# Patient Record
Sex: Female | Born: 1960 | Race: White | Hispanic: No | Marital: Married | Smoking: Never smoker
Health system: Southern US, Community
[De-identification: ages and names within clinical notes are randomized; demographics above are authoritative.]

## PROBLEM LIST (undated history)

## (undated) HISTORY — PX: NECK SURGERY: SHX720

---

## 2017-03-03 ENCOUNTER — Emergency Department (HOSPITAL_COMMUNITY): Payer: Managed Care, Other (non HMO)

## 2017-03-03 ENCOUNTER — Encounter (HOSPITAL_COMMUNITY): Payer: Self-pay | Admitting: Emergency Medicine

## 2017-03-03 ENCOUNTER — Emergency Department (HOSPITAL_COMMUNITY)
Admission: EM | Admit: 2017-03-03 | Discharge: 2017-03-03 | Disposition: A | Discharge status: Home / Self Care | Payer: Managed Care, Other (non HMO) | Attending: Emergency Medicine | Admitting: Emergency Medicine

## 2017-03-03 ENCOUNTER — Other Ambulatory Visit: Payer: Self-pay

## 2017-03-03 DIAGNOSIS — W108XXA Fall (on) (from) other stairs and steps, initial encounter: Secondary | ICD-10-CM | POA: Insufficient documentation

## 2017-03-03 DIAGNOSIS — M25571 Pain in right ankle and joints of right foot: Secondary | ICD-10-CM

## 2017-03-03 DIAGNOSIS — M79671 Pain in right foot: Secondary | ICD-10-CM

## 2017-03-03 DIAGNOSIS — M79672 Pain in left foot: Secondary | ICD-10-CM | POA: Diagnosis not present

## 2017-03-03 DIAGNOSIS — W19XXXA Unspecified fall, initial encounter: Secondary | ICD-10-CM

## 2017-03-03 NOTE — ED Notes (Signed)
Brother in waiting room

## 2017-03-03 NOTE — ED Notes (Signed)
Still in radiology, tech first called

## 2017-03-03 NOTE — ED Provider Notes (Signed)
[facility] EMERGENCY DEPARTMENT Provider Note   CSN: [id number] Arrival date & time: 03/03/17  1900     History   Chief Complaint Chief Complaint  Patient presents with  . Ankle Pain    HPI Cassandra Levine is a 57 y.o. female who presents today for evaluation of acute onset, constant bilateral foot and right ankle pain secondary to fall at around 7 PM.  Patient states that she was going down a few steps while holding an object in both hands when she tripped and inverted her right ankle.  She denies head injury or loss of consciousness.  She endorses initially sharp pain in the right ankle which is now a throbbing pain which radiates to the dorsum of the right foot as well as a mild tenderness to the dorsum of the left foot.  She denies numbness, tingling, or weakness.  She states she took 600 mg of ibuprofen and has been applying ice to the right foot with improvement in her symptoms.  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  The history is provided by the patient.    History reviewed. No pertinent past medical history.  There are no active problems to display for this patient.   Past Surgical History:  Procedure Laterality Date  . NECK SURGERY      OB History    No data available       Home Medications    Prior to Admission medications   Not on File    Family History No family history on file.  Social History Social History   Tobacco Use  . Smoking status: Never Smoker  . Smokeless tobacco: Never Used  Substance Use Topics  . Alcohol use: Yes    Comment: occ  . Drug use: No     Allergies   Bactrim [sulfamethoxazole-trimethoprim] and Ciprofloxacin   Review of Systems Review of Systems  Constitutional: Negative for chills and fever.  Musculoskeletal: Positive for arthralgias (Bilateral foot and right ankle pain).  Neurological: Negative for syncope, weakness, numbness and headaches.     Physical Exam Updated Vital  Signs BP (!) 143/96   Pulse 80   Temp (!) 97.4 F (36.3 C) (Oral)   Resp 18   Ht 5\' 6"  (1.676 m)   Wt 68 kg (150 lb)   SpO2 100%   BMI 24.21 kg/m   Physical Exam  Constitutional: She appears well-developed and well-nourished. No distress.  HENT:  Head: Normocephalic and atraumatic.  Eyes: Conjunctivae are normal. Right eye exhibits no discharge. Left eye exhibits no discharge.  Neck: No JVD present. No tracheal deviation present.  Cardiovascular: Normal rate and intact distal pulses.  2+ DP/PT pulses bilaterally no calf tenderness  Pulmonary/Chest: Effort normal.  Abdominal: She exhibits no distension.  Musculoskeletal: She exhibits tenderness. She exhibits no edema.       Right ankle: She exhibits no swelling, no ecchymosis, no deformity, no laceration and normal pulse. Tenderness. Lateral malleolus tenderness found. No medial malleolus, no AITFL, no CF ligament, no posterior TFL, no head of 5th metatarsal and no proximal fibula tenderness found. Achilles tendon normal.       Left ankle: Normal. Achilles tendon normal.       Left foot: There is normal range of motion, no tenderness, no bony tenderness, no swelling, normal capillary refill, no crepitus, no deformity and no laceration.  Superficial skin abrasion noted to the dorsum of the left foot.  No bleeding.  No tenderness to  palpation.  Mildly decreased range of motion of the right ankle with dorsiflexion.  Point tenderness overlying the lateral malleolus and lateral aspect of the right foot with no ecchymosis, deformity, or crepitus.  No tenderness to palpation of the medial malleolus.  5/5 strength of BUE and BLE major muscle groups.  Antalgic gait, but able to bear weight on the right lower extremity  Neurological: She is alert. No sensory deficit. She exhibits normal muscle tone.  Fluent speech, no facial droop, sensation intact to soft touch of bilateral lower extremities  Skin: Skin is warm and dry. No erythema.   Psychiatric: She has a normal mood and affect. Her behavior is normal.  Nursing note and vitals reviewed.    ED Treatments / Results  Labs (all labs ordered are listed, but only abnormal results are displayed) Labs Reviewed - No data to display  EKG  EKG Interpretation None       Radiology Dg Ankle Complete Right  Result Date: 03/03/2017 CLINICAL DATA:  57 year old who fell while going down stairs earlier this evening. Bilateral foot pain, right greater than left, and right ankle pain. Initial encounter. EXAM: RIGHT ANKLE - COMPLETE 3+ VIEW COMPARISON:  None. FINDINGS: Possible nondisplaced avulsion fracture arising from the medial malleolus. No fractures elsewhere. Ankle mortise intact with well-preserved joint space. Well preserved bone mineral density. IMPRESSION: Possible nondisplaced fracture involving the medial malleolus. Please correlate with point tenderness. No fractures elsewhere. Electronically Signed   By: Hulan Saas M.D.   On: 03/03/2017 20:16   Dg Foot Complete Left  Result Date: 03/03/2017 CLINICAL DATA:  57 year old who fell while going down stairs earlier this evening. Bilateral foot pain, right greater than left. Initial encounter. EXAM: LEFT FOOT - COMPLETE 3+ VIEW COMPARISON:  None. FINDINGS: No evidence of acute fracture or dislocation. Moderate to severe joint space narrowing and associated hypertrophic spurring involving the first MTP joint without associated erosions. Remaining joint spaces well-preserved. No other intrinsic osseous abnormalities. IMPRESSION: 1. No acute osseous abnormality. 2. Osteoarthritis involving the first MTP joint. Electronically Signed   By: Hulan Saas M.D.   On: 03/03/2017 20:14   Dg Foot Complete Right  Result Date: 03/03/2017 CLINICAL DATA:  57 year old who fell while going down stairs earlier this evening. Bilateral foot pain, right greater than left. Initial encounter. EXAM: RIGHT FOOT COMPLETE - 3+ VIEW COMPARISON:  None.  FINDINGS: No evidence of acute fracture or dislocation. Severe joint space narrowing and associated hypertrophic spurring involving the first MTP joint without associated erosions. Remaining joint spaces well-preserved. Minimal calcification at the insertion of the plantar fascia on the plantar surface of the calcaneus. IMPRESSION: 1. No acute osseous abnormality. 2. 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 Electronically Signed   By: Hulan Saas M.D.   On: 03/03/2017 20:17    Procedures Procedures (including critical care time)  Medications Ordered in ED Medications - No data to display   Initial Impression / Assessment and Plan / ED Course  I have reviewed the triage vital signs and the nursing notes.  Pertinent labs & imaging results that were available during my care of the patient were reviewed by me and considered in my medical decision making (see chart for details).     Patient with complaint of bilateral foot pain and right ankle pain secondary to fall earlier today.  No head injury or loss of consciousness.  Afebrile, vital signs are stable.  She is neurovascularly intact.  She is weightbearing despite pain.  No  evidence of Achilles tendon rupture or injury.  Radiographs show MTP arthritis which is known.  Also shows possible nondisplaced fracture of the medial malleolus of the right foot however patient has no tenderness to palpation overlying this area.  I doubt fracture or dislocation at this time. RICE therapy indicated and discussed with patient.  She will be given crutches and a brace.  She will follow-up with primary care physician or orthopedics for reevaluation of her symptoms.  Discussed indications for return to the ED.  Patient and patient's husband verbalized understanding of and agreement with plan and patient is stable for discharge home at this time.  Final Clinical Impressions(s) / ED Diagnoses   Final diagnoses:  Acute right ankle pain  Bilateral  foot pain  Fall, initial encounter    ED Discharge Orders    None       Bennye AlmFawze, Calyse Murcia A, PA-C 03/03/17 2126    Wynetta FinesMessick, Peter C, MD 03/04/17 [phone]

## 2017-03-03 NOTE — ED Triage Notes (Signed)
Pt c/o bilat ankle/foot pain after twisting both when she missed the bottom step @ 30 min ago

## 2017-03-03 NOTE — Discharge Instructions (Signed)
1. Medications: Alternate 600 mg of ibuprofen and [id number] mg of Tylenol every 3 hours as needed for pain. Do not exceed 4000 mg of Tylenol daily.  Take ibuprofen with food to avoid upset stomach. 2. Treatment: rest, ice, elevate and use brace, drink plenty of fluids, gentle stretching 3. Follow Up: Please followup with orthopedics as directed or your PCP in 1 week if no improvement for discussion of your diagnoses and further evaluation after today's visit; Please return to the ER for worsening symptoms or other concerns such as fever, severe swelling, weakness, or loss of pulses

## 2017-03-21 ENCOUNTER — Ambulatory Visit (INDEPENDENT_AMBULATORY_CARE_PROVIDER_SITE_OTHER): Payer: Managed Care, Other (non HMO) | Admitting: Orthopedic Surgery

## 2017-03-21 ENCOUNTER — Encounter (INDEPENDENT_AMBULATORY_CARE_PROVIDER_SITE_OTHER): Payer: Self-pay | Admitting: Orthopedic Surgery

## 2017-03-21 DIAGNOSIS — S93411A Sprain of calcaneofibular ligament of right ankle, initial encounter: Secondary | ICD-10-CM

## 2017-03-21 NOTE — Progress Notes (Signed)
   Office Visit Note   Patient: Cassandra Levine           Date of Birth: 06/19/1960           MRN: [id number] Visit Date: 03/21/2017 Requested by: No referring provider defined for this encounter. PCP: Patient, No Pcp Per  Subjective: Chief Complaint  Patient presents with  . foot/ankle pain    bilateral right worse than left    HPI: Patient presents for evaluation of right ankle.  2-18 after a fall downstairs.  [facility].  Radiographs are reviewed.  No lateral sided bony ankle pathology present on either ankle.  She is not taking any medication for the pain.  Using Epsom soaks daily.  Never had ankle injury before.  She is doing Internet guided exercises with her ankle.  Has a known history of right and left MTP arthritis in the first MTP joint              ROS: All systems reviewed are negative as they relate to the chief complaint within the history of present illness.  Patient denies  fevers or chills.   Assessment & Plan: Visit Diagnoses:  1. Sprain of calcaneofibular ligament of right ankle, initial encounter     Plan: Impression is right ankle sprain.  No evidence of fracture.  She is actually healing up quite well.  No further intervention indicated.  Follow-up as needed  Follow-Up Instructions: Return if symptoms worsen or fail to improve.   Orders:  No orders of the defined types were placed in this encounter.  No orders of the defined types were placed in this encounter.     Procedures: No procedures performed   Clinical Data: No additional findings.  Objective: Vital Signs: There were no vitals taken for this visit.  Physical Exam:   Constitutional: Patient appears well-developed HEENT:  Head: Normocephalic Eyes:EOM are normal Neck: Normal range of motion Cardiovascular: Normal rate Pulmonary/chest: Effort normal Neurologic: Patient is alert Skin: Skin is warm Psychiatric: Patient has normal mood and affect    Ortho Exam:  Orthopedic exam demonstrates palpable intact nontender anterior to posterior to peroneal and Achilles tendons bilaterally.  Not much in the way of ankle instability to anterior drawer testing and varus pedal pulses palpable.  No masses lymphadenopathy or skin changes noted in that region.  No medial sided tenderness present.  No real swelling.  Only mild tenderness over the ATFL and CFL on that right ankle  Specialty Comments:  No specialty comments available.  Imaging: No results found.   PMFS History: There are no active problems to display for this patient.  History reviewed. No pertinent past medical history.  History reviewed. No pertinent family history.  Past Surgical History:  Procedure Laterality Date  . NECK SURGERY     Social History   Occupational History  . Not on file  Tobacco Use  . Smoking status: Never Smoker  . Smokeless tobacco: Never Used  Substance and Sexual Activity  . Alcohol use: Yes    Comment: occ  . Drug use: No  . Sexual activity: Not on file

## 2018-02-11 ENCOUNTER — Telehealth (INDEPENDENT_AMBULATORY_CARE_PROVIDER_SITE_OTHER): Payer: Self-pay | Admitting: Orthopedic Surgery

## 2018-02-11 NOTE — Telephone Encounter (Signed)
Patient called wanted to see if she can be referred to a physical therapist for her planter fascitis. I did offer patient an appointment she wanted to check with Dr.Dean first due to her insurance. Please call patient to advise

## 2018-02-11 NOTE — Telephone Encounter (Signed)
Chenise, can you please call patient back and tell her she will need an appointment prior to any therapy being ordered? Thanks so much.

## 2018-02-11 NOTE — Telephone Encounter (Signed)
We do not really have a diagnosis of plantar fasciitis for her.  Hard to refer her to therapy for plantar fasciitis if we have not really seen her particularly since it has been a year.  Probably would be best if she wants to go to therapy that she come in for so we can look at it and then refer her

## 2018-02-11 NOTE — Telephone Encounter (Signed)
Last seen 03/2017.  Ok to refer?  Or need to see in office?

## 2019-06-22 IMAGING — DX DG ANKLE COMPLETE 3+V*R*
3 series · 3 of 3 positions shown · non-contrast
Comparison: None.

CLINICAL DATA: 56-year-old who fell while going down stairs earlier
this evening. Bilateral foot pain, right greater than left, and
right ankle pain. Initial encounter.

EXAM:
RIGHT ANKLE - COMPLETE 3+ VIEW

[ankle ap]
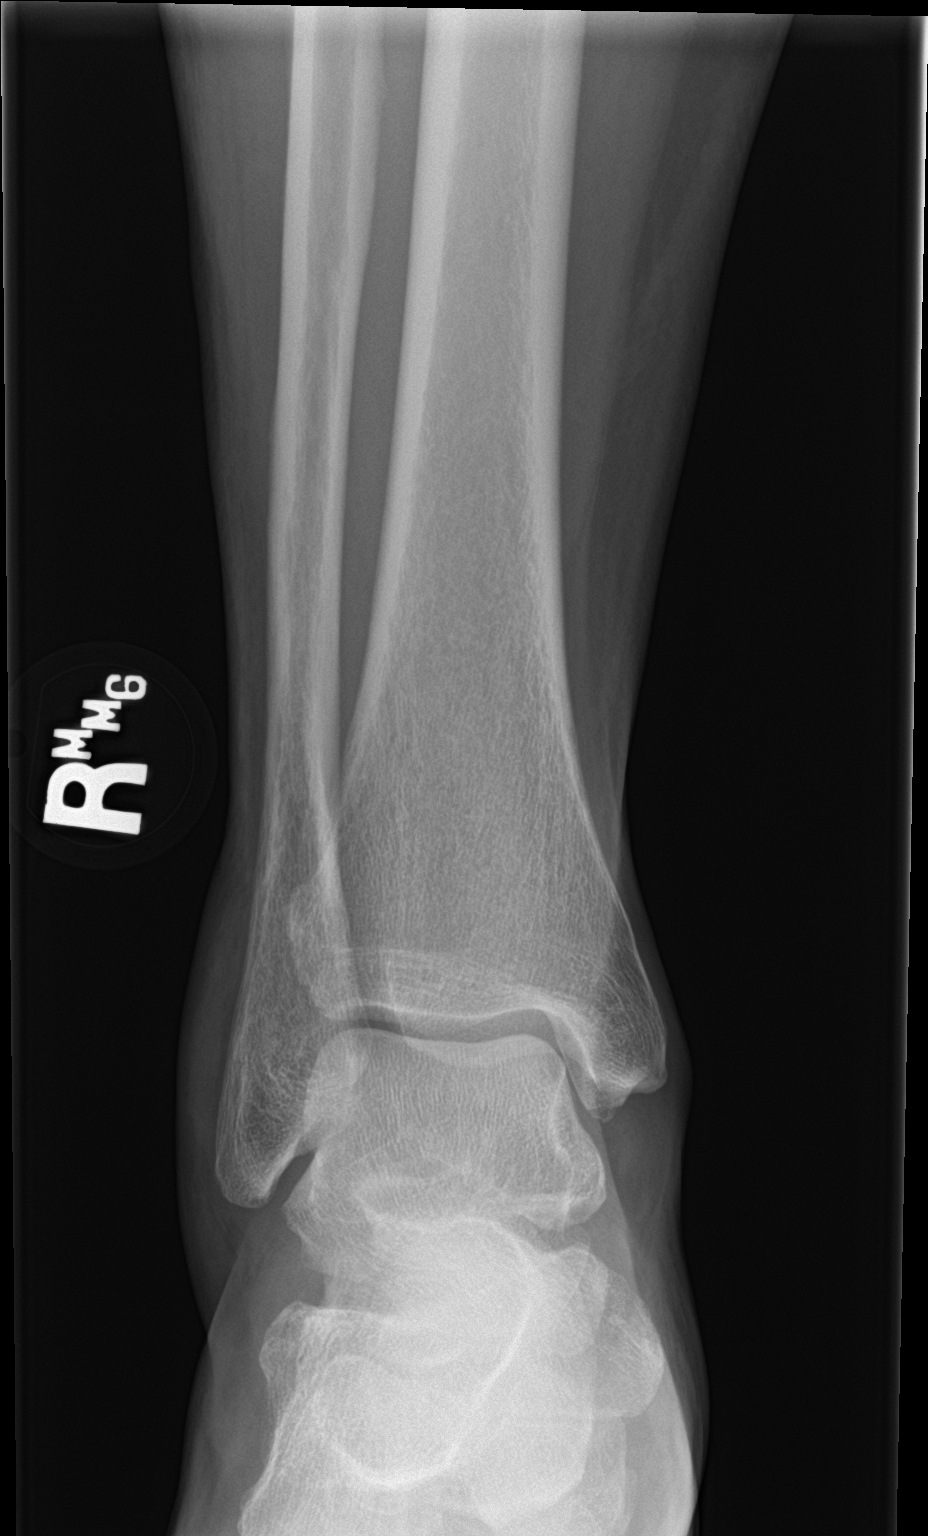

[ankle obl]
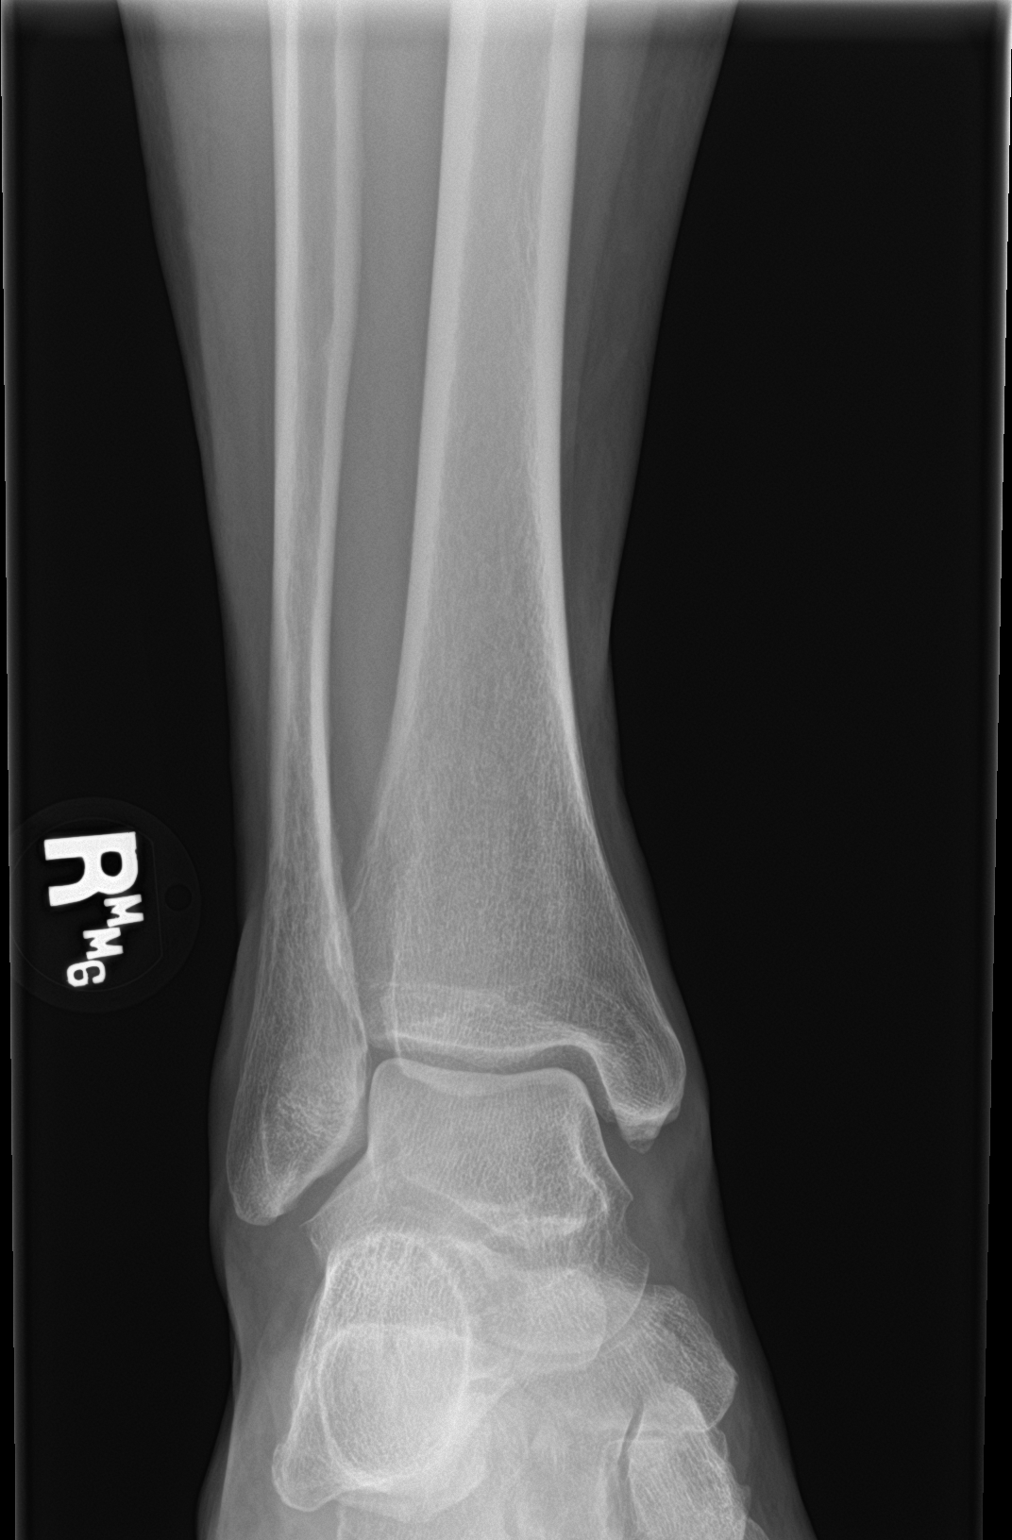

[ankle lat]
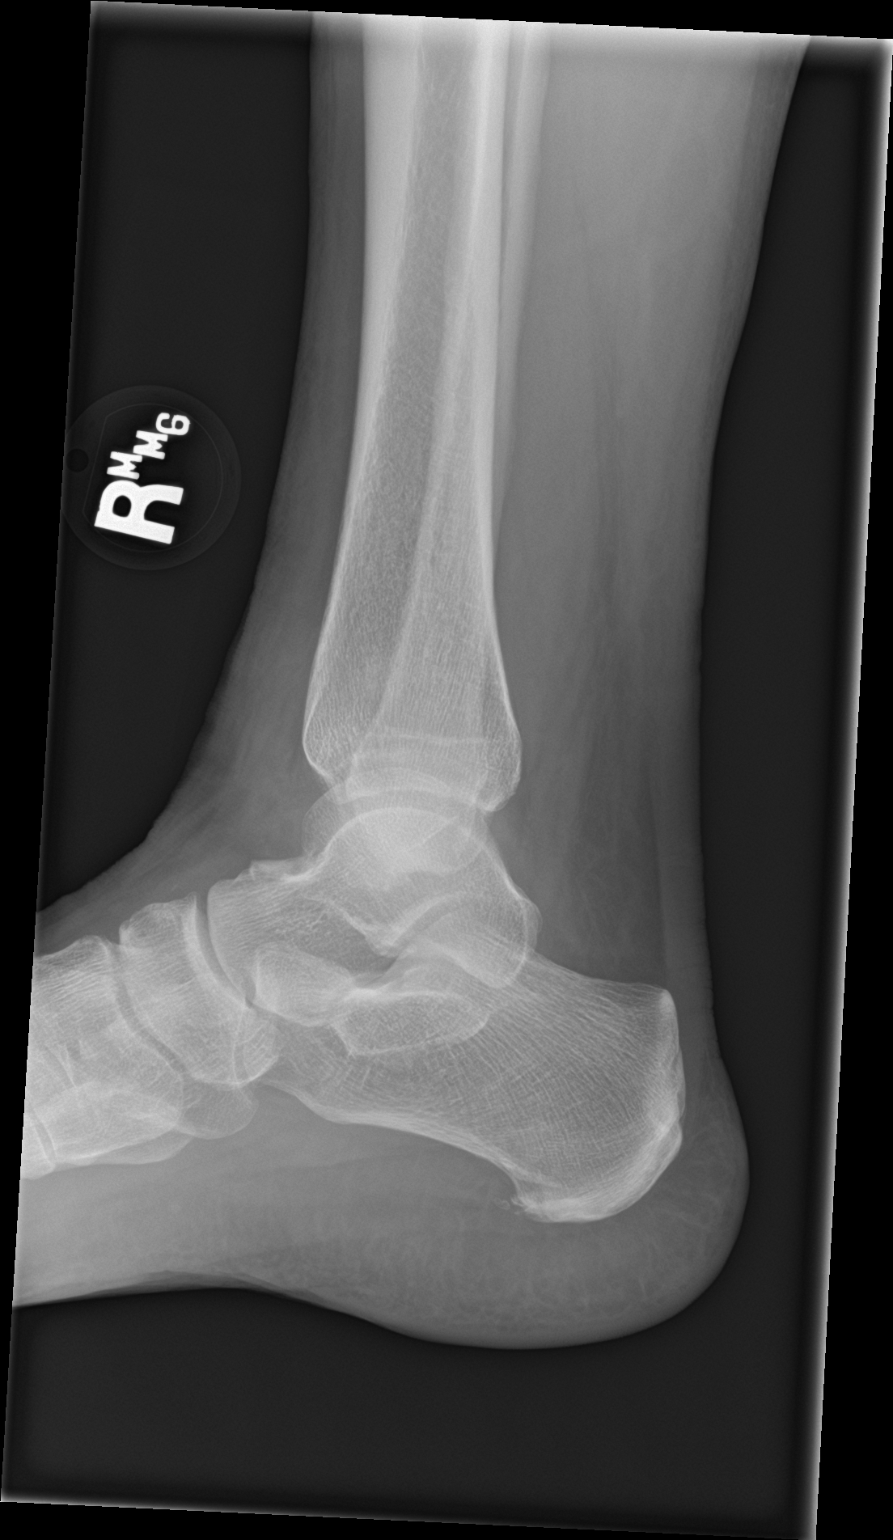

[3 of 3 positions shown; findings below may reference images not displayed]

FINDINGS: Possible nondisplaced avulsion fracture arising from the medial
malleolus. No fractures elsewhere. Ankle mortise intact with
well-preserved joint space. Well preserved bone mineral density.
IMPRESSION: Possible nondisplaced fracture involving the medial malleolus.
Please correlate with point tenderness. No fractures elsewhere.

## 2020-05-12 DIAGNOSIS — M5030 Other cervical disc degeneration, unspecified cervical region: Secondary | ICD-10-CM | POA: Diagnosis not present

## 2020-05-12 DIAGNOSIS — K644 Residual hemorrhoidal skin tags: Secondary | ICD-10-CM | POA: Diagnosis not present

## 2020-05-12 DIAGNOSIS — G43019 Migraine without aura, intractable, without status migrainosus: Secondary | ICD-10-CM | POA: Diagnosis not present

## 2020-06-02 DIAGNOSIS — H04123 Dry eye syndrome of bilateral lacrimal glands: Secondary | ICD-10-CM | POA: Diagnosis not present

## 2020-06-02 DIAGNOSIS — H52223 Regular astigmatism, bilateral: Secondary | ICD-10-CM | POA: Diagnosis not present

## 2020-06-02 DIAGNOSIS — H524 Presbyopia: Secondary | ICD-10-CM | POA: Diagnosis not present

## 2020-06-02 DIAGNOSIS — H40033 Anatomical narrow angle, bilateral: Secondary | ICD-10-CM | POA: Diagnosis not present

## 2020-06-02 DIAGNOSIS — H53002 Unspecified amblyopia, left eye: Secondary | ICD-10-CM | POA: Diagnosis not present

## 2020-06-02 DIAGNOSIS — H2513 Age-related nuclear cataract, bilateral: Secondary | ICD-10-CM | POA: Diagnosis not present

## 2020-06-02 DIAGNOSIS — H5203 Hypermetropia, bilateral: Secondary | ICD-10-CM | POA: Diagnosis not present

## 2020-06-07 DIAGNOSIS — R29898 Other symptoms and signs involving the musculoskeletal system: Secondary | ICD-10-CM | POA: Diagnosis not present

## 2020-06-07 DIAGNOSIS — M5030 Other cervical disc degeneration, unspecified cervical region: Secondary | ICD-10-CM | POA: Diagnosis not present

## 2021-02-21 DIAGNOSIS — Z1231 Encounter for screening mammogram for malignant neoplasm of breast: Secondary | ICD-10-CM | POA: Diagnosis not present

## 2021-04-14 DIAGNOSIS — E7800 Pure hypercholesterolemia, unspecified: Secondary | ICD-10-CM | POA: Diagnosis not present

## 2021-04-14 DIAGNOSIS — Z131 Encounter for screening for diabetes mellitus: Secondary | ICD-10-CM | POA: Diagnosis not present

## 2021-04-14 DIAGNOSIS — Z79899 Other long term (current) drug therapy: Secondary | ICD-10-CM | POA: Diagnosis not present

## 2021-04-14 DIAGNOSIS — Z0000 Encounter for general adult medical examination without abnormal findings: Secondary | ICD-10-CM | POA: Diagnosis not present
